# Patient Record
Sex: Male | Born: 1985 | Race: Black or African American | Hispanic: No | Marital: Single | Smoking: Never smoker
Health system: Southern US, Community
[De-identification: ages and names within clinical notes are randomized; demographics above are authoritative.]

---

## 2008-08-12 ENCOUNTER — Emergency Department (HOSPITAL_BASED_OUTPATIENT_CLINIC_OR_DEPARTMENT_OTHER): Admission: EM | Admit: 2008-08-12 | Discharge: 2008-08-12 | Payer: Self-pay | Admitting: Emergency Medicine

## 2011-07-15 LAB — COMPREHENSIVE METABOLIC PANEL
ALT: <6
AST: 24
Alkaline Phosphatase: 108
BUN: 11
Calcium: 9
Chloride: 100
Creatinine, Ser: 1
GFR calc Af Amer: >60
Potassium: 2.9 — ABNORMAL LOW
Total Protein: 8.2

## 2011-07-15 LAB — URINALYSIS, ROUTINE W REFLEX MICROSCOPIC
Protein, ur: NEGATIVE
Specific Gravity, Urine: 1.015
Urobilinogen, UA: 0.2

## 2011-07-15 LAB — CBC
MCHC: 32.9
MCV: 86.9
RDW: 13.3
WBC: 10.6 — ABNORMAL HIGH

## 2011-07-15 LAB — POCT TOXICOLOGY PANEL

## 2011-07-15 LAB — DIFFERENTIAL
Eosinophils Absolute: 1.2 — ABNORMAL HIGH
Lymphs Abs: 2.8
Monocytes Relative: 7

## 2011-07-15 LAB — ETHANOL: Alcohol, Ethyl (B): <5

## 2012-08-24 ENCOUNTER — Ambulatory Visit
Admission: RE | Admit: 2012-08-24 | Discharge: 2012-08-24 | Disposition: A | Discharge status: Home / Self Care | Payer: BC Managed Care – PPO | Source: Ambulatory Visit | Attending: Emergency Medicine | Admitting: Emergency Medicine

## 2012-08-24 ENCOUNTER — Other Ambulatory Visit: Payer: Self-pay | Admitting: Emergency Medicine

## 2012-08-24 DIAGNOSIS — S93401A Sprain of unspecified ligament of right ankle, initial encounter: Secondary | ICD-10-CM

## 2014-06-05 IMAGING — CR DG ANKLE COMPLETE 3+V*R*
3 series · 3 of 3 positions shown · non-contrast
Comparison: None.

CLINICAL DATA: Ankle sprain, posterior pain

RIGHT ANKLE - COMPLETE 3+ VIEW

[t ankle joint ap right]
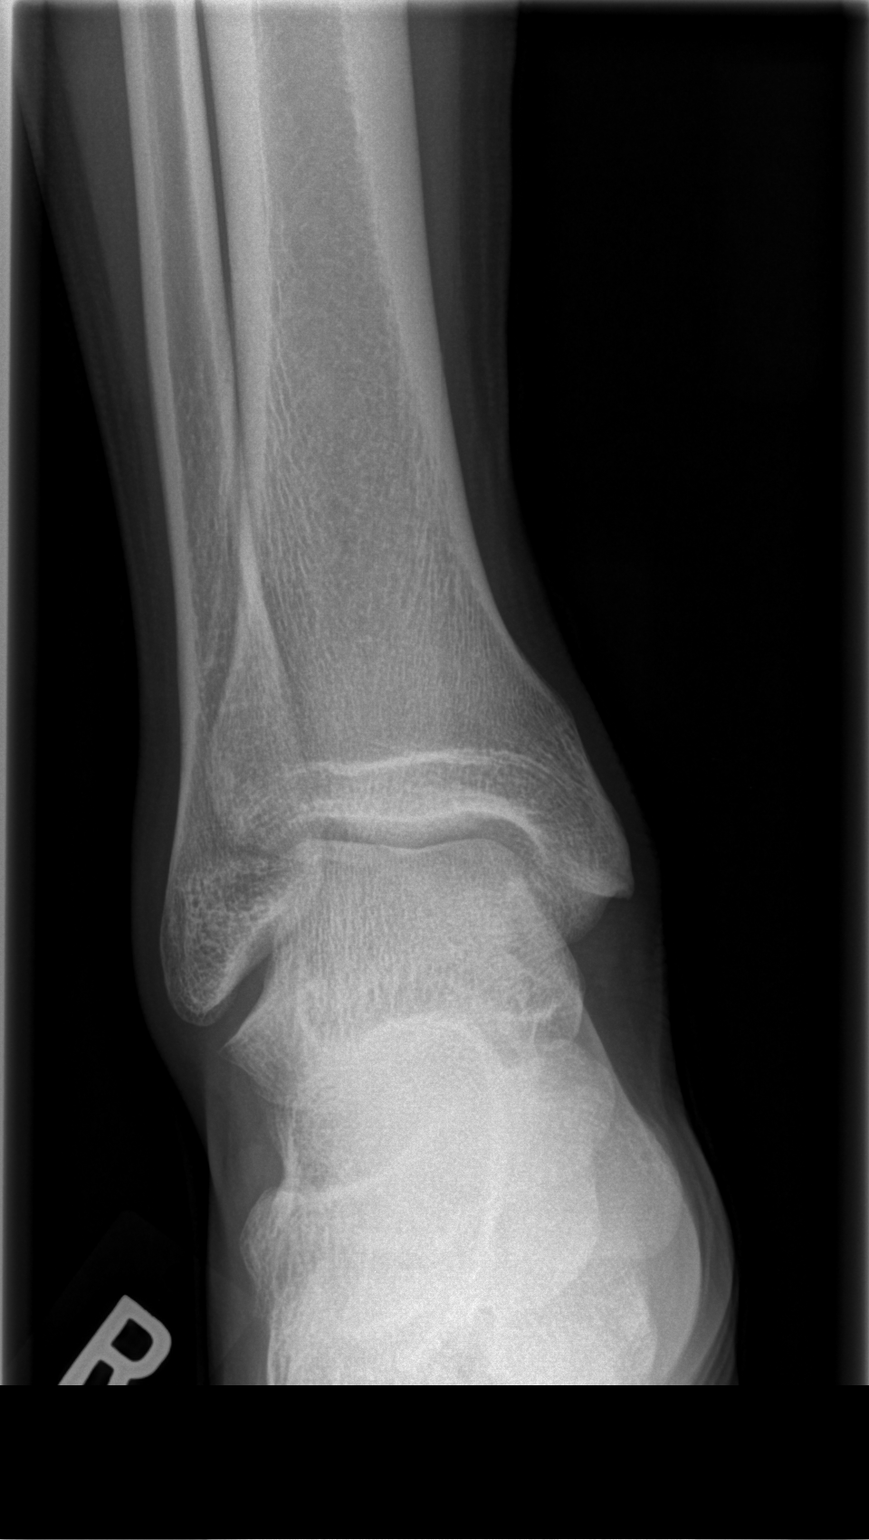

[t ankle joint oblique right]
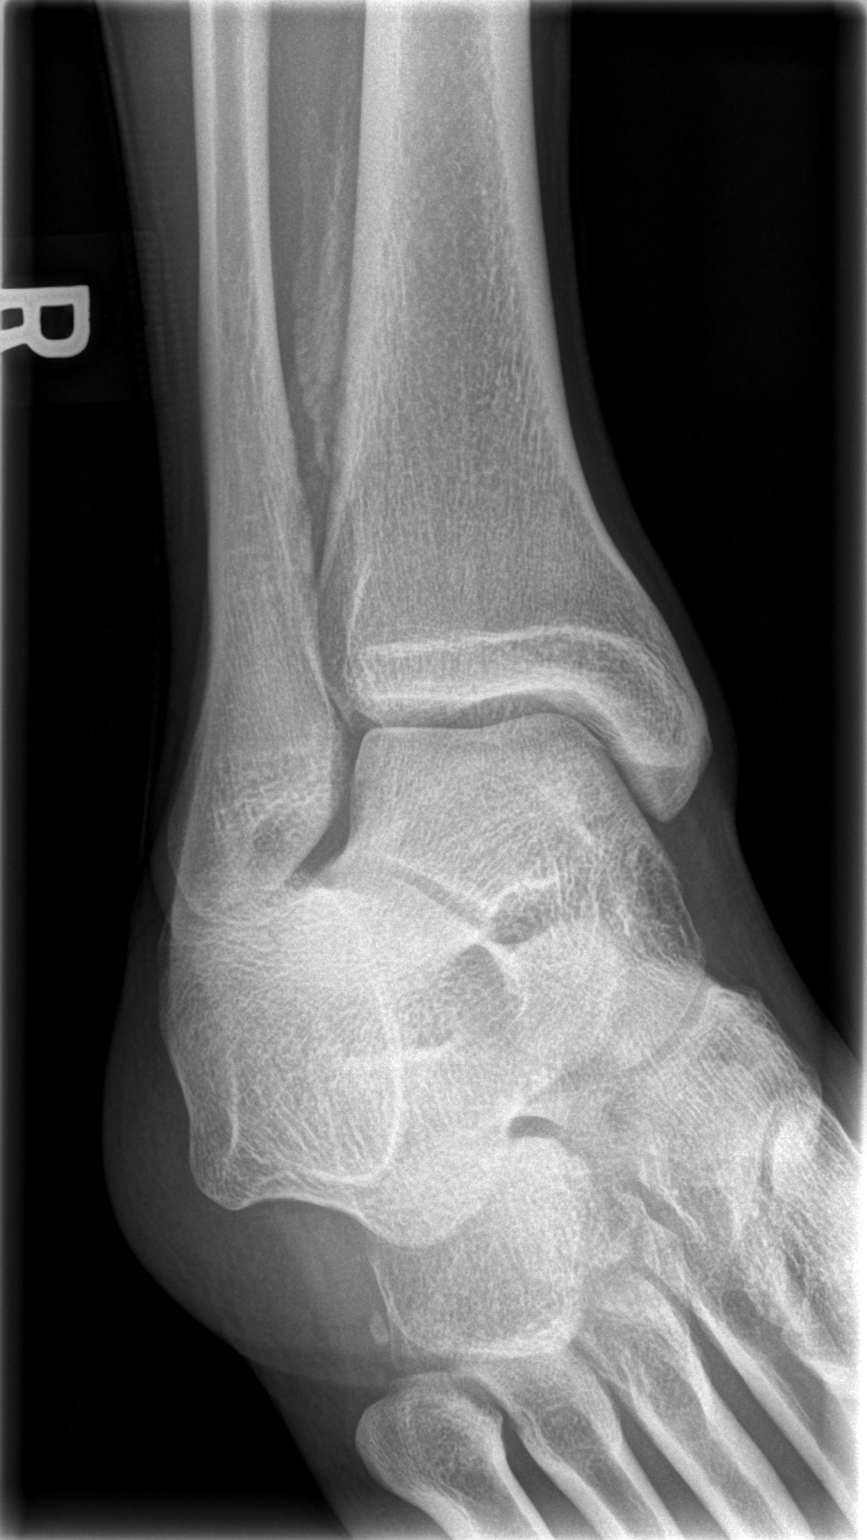

[t ankle joint lat right]
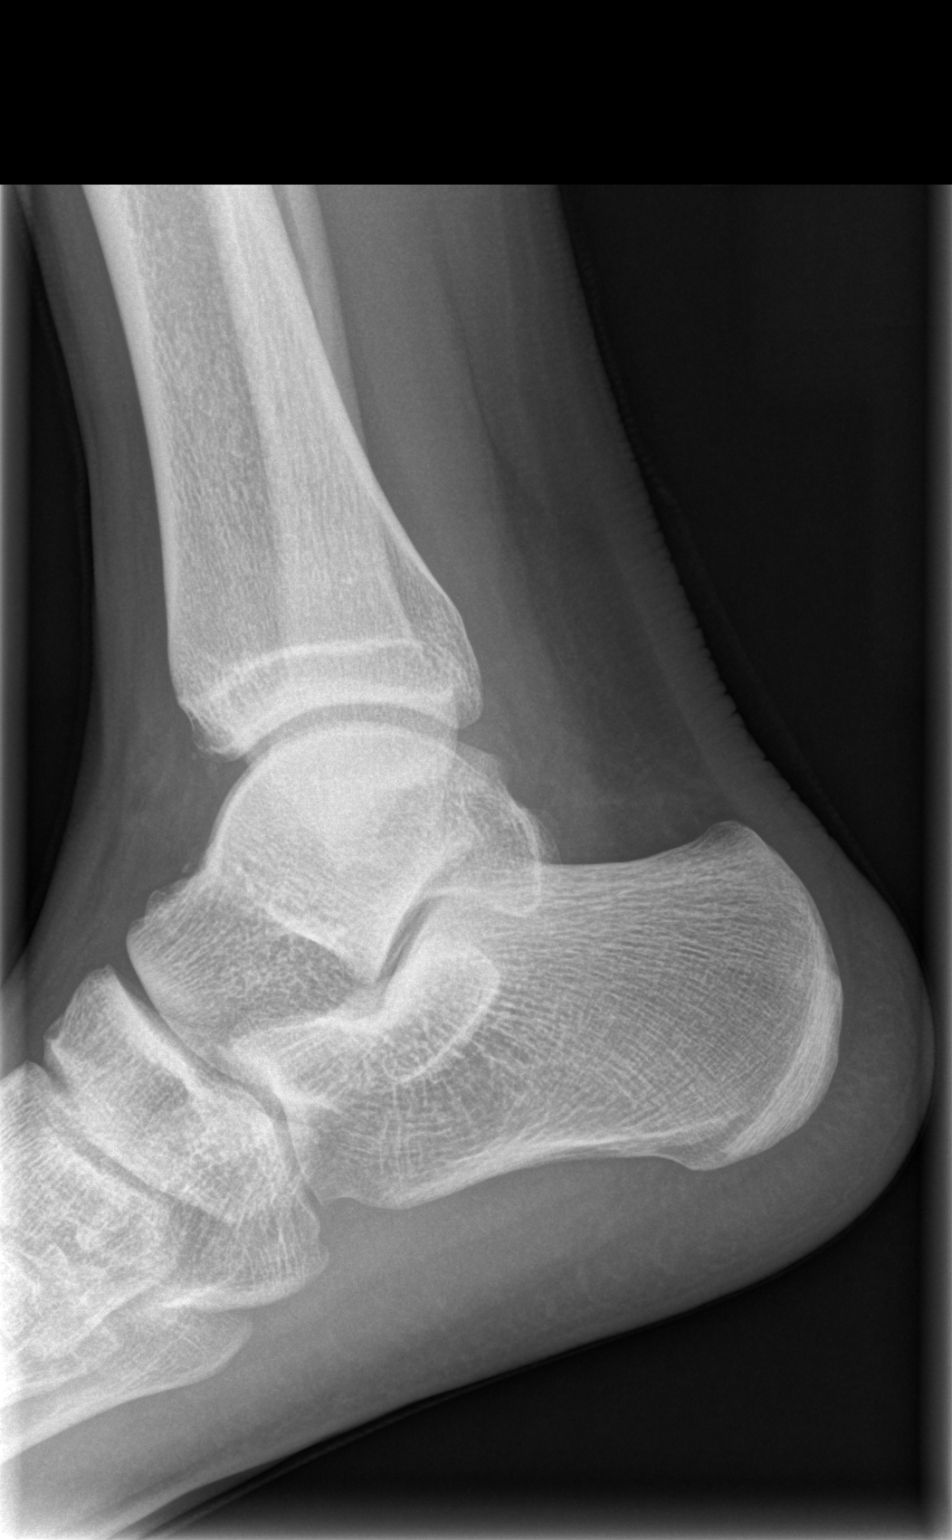

[3 of 3 positions shown; findings below may reference images not displayed]

FINDINGS: Normal alignment without fracture or effusion.
Ossification of the interosseous membrane between the right distal
tibia and fibula.  Malleoli, talus and calcaneus appear intact.
IMPRESSION: No acute osseous finding.

## 2019-05-17 ENCOUNTER — Other Ambulatory Visit (HOSPITAL_COMMUNITY): Payer: Self-pay | Admitting: Family Medicine

## 2019-05-17 DIAGNOSIS — R002 Palpitations: Secondary | ICD-10-CM

## 2019-06-14 ENCOUNTER — Ambulatory Visit (HOSPITAL_COMMUNITY)
Admission: EM | Admit: 2019-06-14 | Discharge: 2019-06-14 | Disposition: A | Discharge status: Home / Self Care | Payer: Managed Care, Other (non HMO) | Attending: Family Medicine | Admitting: Family Medicine

## 2019-06-14 ENCOUNTER — Other Ambulatory Visit: Payer: Self-pay

## 2019-06-14 ENCOUNTER — Encounter (HOSPITAL_COMMUNITY): Payer: Self-pay

## 2019-06-14 DIAGNOSIS — S86011A Strain of right Achilles tendon, initial encounter: Secondary | ICD-10-CM

## 2019-06-14 DIAGNOSIS — Y9367 Activity, basketball: Secondary | ICD-10-CM

## 2019-06-14 MED ORDER — IBUPROFEN 800 MG PO TABS: 800.0000 mg | ORAL_TABLET | Freq: Three times a day (TID) | ORAL | 0 refills | Status: AC

## 2019-06-14 NOTE — ED Provider Notes (Signed)
[facility]    CSN: [id number] Arrival date & time: 06/14/19  1008      History   Chief Complaint Chief Complaint  Patient presents with  . Appointment    10;10  . Ankle Injury    Right    HPI Richard Prudence DavidsonJabbi is a 33 y.o. male.   HPI  Patient was playing basketball last night.  He went to make a jump and felt a pop in the back of his right ankle.  He felt like something hit him right in the Achilles tendon.  He has had trouble weightbearing ever since then.  Pain with movement of his ankle.  History reviewed. No pertinent past medical history.  There are no active problems to display for this patient.   History reviewed. No pertinent surgical history.     Home Medications    Prior to Admission medications   Medication Sig Start Date End Date Taking? Authorizing Provider  ibuprofen (ADVIL) 800 MG tablet Take 1 tablet (800 mg total) by mouth 3 (three) times daily. 06/14/19   Eustace MooreNelson, Deakon Frix Sue, MD    Family History Family History  Problem Relation Age of Onset  . Healthy Mother   . Healthy Father     Social History Social History   Tobacco Use  . Smoking status: Never Smoker  . Smokeless tobacco: Never Used  Substance Use Topics  . Alcohol use: Yes    Comment: socially  . Drug use: Never     Allergies   Patient has no known allergies.   Review of Systems Review of Systems  Constitutional: Negative for chills and fever.  HENT: Negative for ear pain and sore throat.   Eyes: Negative for pain and visual disturbance.  Respiratory: Negative for cough and shortness of breath.   Cardiovascular: Negative for chest pain and palpitations.  Gastrointestinal: Negative for abdominal pain and vomiting.  Genitourinary: Negative for dysuria and hematuria.  Musculoskeletal: Positive for gait problem. Negative for arthralgias and back pain.  Skin: Negative for color change and rash.  Neurological: Negative for seizures and syncope.  All other systems  reviewed and are negative.    Physical Exam Triage Vital Signs ED Triage Vitals  Enc Vitals Group     BP 06/14/19 1032 132/89     Pulse Rate 06/14/19 1032 80     Resp 06/14/19 1032 14     Temp 06/14/19 1032 97.9 F (36.6 C)     Temp Source 06/14/19 1032 Oral     SpO2 06/14/19 1032 100 %     Weight --      Height --      Head Circumference --      Peak Flow --      Pain Score 06/14/19 1030 9     Pain Loc --      Pain Edu? --      Excl. in [place]? --    No data found.  Updated Vital Signs BP 132/89 (BP Location: Left Arm)   Pulse 80   Temp 97.9 F (36.6 C) (Oral)   Resp 14   SpO2 100%   Visual Acuity Right Eye Distance:   Left Eye Distance:   Bilateral Distance:    Right Eye Near:   Left Eye Near:    Bilateral Near:     Physical Exam Constitutional:      General: He is not in acute distress.    Appearance: He is well-developed.  HENT:  Head: Normocephalic and atraumatic.  Eyes:     Conjunctiva/sclera: Conjunctivae normal.     Pupils: Pupils are equal, round, and reactive to light.  Neck:     Musculoskeletal: Normal range of motion.  Cardiovascular:     Rate and Rhythm: Normal rate.  Pulmonary:     Effort: Pulmonary effort is normal. No respiratory distress.  Abdominal:     General: There is no distension.     Palpations: Abdomen is soft.  Musculoskeletal: Normal range of motion.     Comments: Antalgic gait.  Visible and palpable defect in the right Achilles tendon.  When standing, patient is unable to lift heel off of the floor.  Skin:    General: Skin is warm and dry.  Neurological:     Mental Status: He is alert.      UC Treatments / Results  Labs (all labs ordered are listed, but only abnormal results are displayed) Labs Reviewed - No data to display  EKG   Radiology No results found.  Procedures Procedures (including critical care time)  Medications Ordered in UC Medications - No data to display  Initial Impression / Assessment  and Plan / UC Course  I have reviewed the triage vital signs and the nursing notes.  Pertinent labs & imaging results that were available during my care of the patient were reviewed by me and considered in my medical decision making (see chart for details).     I called the sports medicine clinic to discuss management.  They recommend a foot and ankle specialist.  I called Dr. Pollie Friar office at [facility] orthopedic.  I secured an appointment with Dr. Lucia Gaskins for later this week.  I received instructions on how to manage his injury in the meantime.  He states discussed injury with patient.  He may need surgery.  Activity level reviewed.  Pain management reviewed.  Reasons for return reviewed. Final Clinical Impressions(s) / UC Diagnoses   Final diagnoses:  Rupture of right Achilles tendon, initial encounter     Discharge Instructions     Leave the brace on as much as you are able Ice to area Limit movement of the ankle CALL DR ADAIR today to set up an appointment Ibuprofen for pain   ED Prescriptions    Medication Sig Dispense Auth. Provider   ibuprofen (ADVIL) 800 MG tablet Take 1 tablet (800 mg total) by mouth 3 (three) times daily. 21 tablet Raylene Everts, MD     Controlled Substance Prescriptions [place] Controlled Substance Registry consulted? Not Applicable   Raylene Everts, MD 06/14/19 1415

## 2019-06-14 NOTE — ED Triage Notes (Signed)
Patient presents to Urgent Care with complaints of right ankle pain since injuring it while playing basketball yesterday. Patient reports he felt like his heel "popped", and he is worried he has done something to his Achilles tendon.

## 2019-06-14 NOTE — Discharge Instructions (Signed)
Leave the brace on as much as you are able Ice to area Limit movement of the ankle CALL DR ADAIR today to set up an appointment Ibuprofen for pain

## 2019-06-24 ENCOUNTER — Encounter (HOSPITAL_COMMUNITY): Payer: Self-pay | Admitting: Family Medicine

## 2019-06-28 ENCOUNTER — Telehealth (HOSPITAL_COMMUNITY): Payer: Self-pay

## 2019-06-28 NOTE — Telephone Encounter (Signed)
New message   Just an FYI. We have made several attempts to contact this patient including sending a letter to schedule or reschedule their echocardiogram. We will be removing the patient from the echo WQ.   9.15.20 @ 10:42am both # are the same - not a working #  9.11.20 mail reminder letter [facility]  8.4.20 @ 3:04pm lm on home vm - Jaegar Croft

## 2019-11-01 ENCOUNTER — Other Ambulatory Visit: Payer: Self-pay

## 2019-11-01 ENCOUNTER — Encounter (HOSPITAL_COMMUNITY): Payer: Self-pay | Admitting: Emergency Medicine

## 2019-11-01 ENCOUNTER — Ambulatory Visit (HOSPITAL_COMMUNITY)
Admission: EM | Admit: 2019-11-01 | Discharge: 2019-11-01 | Disposition: A | Discharge status: Home / Self Care | Payer: Managed Care, Other (non HMO) | Attending: Family Medicine | Admitting: Family Medicine

## 2019-11-01 DIAGNOSIS — H6693 Otitis media, unspecified, bilateral: Secondary | ICD-10-CM | POA: Diagnosis not present

## 2019-11-01 DIAGNOSIS — H66003 Acute suppurative otitis media without spontaneous rupture of ear drum, bilateral: Secondary | ICD-10-CM

## 2019-11-01 MED ORDER — FLUTICASONE PROPIONATE 50 MCG/ACT NA SUSP: 1.0000 | Freq: Every day | NASAL | 2 refills | Status: AC

## 2019-11-01 MED ORDER — AMOXICILLIN 875 MG PO TABS: 875.0000 mg | ORAL_TABLET | Freq: Two times a day (BID) | ORAL | 0 refills | Status: AC

## 2019-11-01 NOTE — ED Notes (Signed)
Bed: UC01 Expected date: 11/01/19 Expected time:  Means of arrival:  Comments: APPT at 1700

## 2019-11-01 NOTE — ED Provider Notes (Signed)
[facility]    CSN: [id number] Arrival date & time: 11/01/19  1611      History   Chief Complaint Chief Complaint  Patient presents with  . Appointment    05:00  . URI    HPI Richard Erickson is a 34 y.o. male.   Patient has upper respiratory symptoms and now complains of his ears are popping and he is dizzy.  Started sneezing only about an hour ago.  There is been no air travel or other behaviors which would lead to this problem  HPI  History reviewed. No pertinent past medical history.  There are no problems to display for this patient.   History reviewed. No pertinent surgical history.     Home Medications    Prior to Admission medications   Medication Sig Start Date End Date Taking? Authorizing Provider  ALPRAZolam Duanne Moron) 0.25 MG tablet Take 0.25 mg by mouth at bedtime as needed for anxiety.    [provider]  ibuprofen (ADVIL) 800 MG tablet Take 1 tablet (800 mg total) by mouth 3 (three) times daily. 06/14/19   Raylene Everts, MD    Family History Family History  Problem Relation Age of Onset  . Healthy Mother   . Healthy Father     Social History Social History   Tobacco Use  . Smoking status: Never Smoker  . Smokeless tobacco: Never Used  Substance Use Topics  . Alcohol use: Not Currently    Comment: socially  . Drug use: Never     Allergies   Patient has no known allergies.   Review of Systems Review of Systems  HENT: Positive for congestion and hearing loss.   Neurological: Positive for dizziness.  All other systems reviewed and are negative.    Physical Exam Triage Vital Signs ED Triage Vitals  Enc Vitals Group     BP 11/01/19 1701 133/85     Pulse Rate 11/01/19 1701 94     Resp 11/01/19 1701 18     Temp 11/01/19 1701 98.7 F (37.1 C)     Temp Source 11/01/19 1701 Oral     SpO2 11/01/19 1701 97 %     Weight --      Height --      Head Circumference --      Peak Flow --      Pain Score 11/01/19 1658  6     Pain Loc --      Pain Edu? --      Excl. in [place]? --    No data found.  Updated Vital Signs BP 133/85 (BP Location: Right Arm)   Pulse 94   Temp 98.7 F (37.1 C) (Oral)   Resp 18   SpO2 97%   Visual Acuity Right Eye Distance:   Left Eye Distance:   Bilateral Distance:    Right Eye Near:   Left Eye Near:    Bilateral Near:     Physical Exam Vitals and nursing note reviewed.  Constitutional:      Appearance: Normal appearance. He is normal weight.  HENT:     Head: Normocephalic.     Ears:     Comments: Both TMs are bulging and have a reddish tent and are immobile to with Valsalva maneuver    Nose: Nose normal.     Mouth/Throat:     Mouth: Mucous membranes are moist.     Pharynx: Oropharynx is clear.  Neurological:     Mental Status: He  is alert.      UC Treatments / Results  Labs (all labs ordered are listed, but only abnormal results are displayed) Labs Reviewed - No data to display  EKG   Radiology No results found.  Procedures Procedures (including critical care time)  Medications Ordered in UC Medications - No data to display  Initial Impression / Assessment and Plan / UC Course  I have reviewed the triage vital signs and the nursing notes.  Pertinent labs & imaging results that were available during my care of the patient were reviewed by me and considered in my medical decision making (see chart for details).     Bilateral otitis media Final Clinical Impressions(s) / UC Diagnoses   Final diagnoses:  None   Discharge Instructions   None    ED Prescriptions    None     PDMP not reviewed this encounter.   Frederica Kuster, MD 11/01/19 1730

## 2019-11-01 NOTE — ED Triage Notes (Signed)
Left ear popping, painful for one week and feels dizzy/unsteady.  Sniffles started one hour ago.

## 2019-11-05 ENCOUNTER — Other Ambulatory Visit: Payer: Self-pay

## 2019-11-05 ENCOUNTER — Ambulatory Visit
Admission: EM | Admit: 2019-11-05 | Discharge: 2019-11-05 | Disposition: A | Discharge status: Home / Self Care | Payer: Managed Care, Other (non HMO) | Attending: Physician Assistant | Admitting: Physician Assistant

## 2019-11-05 ENCOUNTER — Inpatient Hospital Stay
Admission: RE | Admit: 2019-11-05 | Discharge: 2019-11-05 | Disposition: A | Discharge status: Home / Self Care | Payer: Managed Care, Other (non HMO) | Source: Ambulatory Visit

## 2019-11-05 DIAGNOSIS — H6983 Other specified disorders of Eustachian tube, bilateral: Secondary | ICD-10-CM | POA: Diagnosis not present

## 2019-11-05 DIAGNOSIS — J3489 Other specified disorders of nose and nasal sinuses: Secondary | ICD-10-CM

## 2019-11-05 MED ORDER — AZELASTINE HCL 0.1 % NA SOLN: 2.0000 | Freq: Two times a day (BID) | NASAL | 0 refills | Status: AC

## 2019-11-05 MED ORDER — PREDNISONE 50 MG PO TABS: 50.0000 mg | ORAL_TABLET | Freq: Every day | ORAL | 0 refills | Status: AC

## 2019-11-05 NOTE — ED Provider Notes (Signed)
[facility]    CSN: [id number] Arrival date & time: 11/05/19  1135      History   Chief Complaint No chief complaint on file.   HPI Richard Erickson is a 34 y.o. male.   35 year old male comes in for continued ear fullness/popping sensation after being seen 11/01/2019.  At that time, was started on Augmentin for otitis media, and started on Flonase for symptomatic management.  States pain has improved since taking antibiotics, but has continued with ear fullness, and popping sensation to the ear.  States nasal congestion, cough has also improved.  Denies fever, chills, body aches.  Has been using medications as prescribed.      History reviewed. No pertinent past medical history.  There are no problems to display for this patient.   History reviewed. No pertinent surgical history.     Home Medications    Prior to Admission medications   Medication Sig Start Date End Date Taking? Authorizing Provider  ALPRAZolam Prudy Feeler) 0.25 MG tablet Take 0.25 mg by mouth at bedtime as needed for anxiety.    [provider]  amoxicillin (AMOXIL) 875 MG tablet Take 1 tablet (875 mg total) by mouth 2 (two) times daily. 11/01/19   Frederica Kuster, MD  azelastine (ASTELIN) 0.1 % nasal spray Place 2 sprays into both nostrils 2 (two) times daily. 11/05/19   Cathie Hoops, Jonanthony Nahar V, PA-C  fluticasone (FLONASE) 50 MCG/ACT nasal spray Place 1 spray into both nostrils daily. 11/01/19   Frederica Kuster, MD  ibuprofen (ADVIL) 800 MG tablet Take 1 tablet (800 mg total) by mouth 3 (three) times daily. 06/14/19   Eustace Moore, MD  predniSONE (DELTASONE) 50 MG tablet Take 1 tablet (50 mg total) by mouth daily with breakfast. 11/05/19   Belinda Fisher, PA-C    Family History Family History  Problem Relation Age of Onset  . Healthy Mother   . Healthy Father     Social History Social History   Tobacco Use  . Smoking status: Never Smoker  . Smokeless tobacco: Never Used  Substance Use Topics    . Alcohol use: Not Currently    Comment: socially  . Drug use: Never     Allergies   Patient has no known allergies.   Review of Systems Review of Systems  Reason unable to perform ROS: See HPI as above.     Physical Exam Triage Vital Signs ED Triage Vitals  Enc Vitals Group     BP 11/05/19 1144 114/77     Pulse Rate 11/05/19 1144 72     Resp 11/05/19 1144 17     Temp 11/05/19 1144 98.2 F (36.8 C)     Temp Source 11/05/19 1144 Oral     SpO2 11/05/19 1144 96 %     Weight --      Height --      Head Circumference --      Peak Flow --      Pain Score 11/05/19 1145 6     Pain Loc --      Pain Edu? --      Excl. in [place]? --    No data found.  Updated Vital Signs BP 114/77 (BP Location: Left Arm)   Pulse 72   Temp 98.2 F (36.8 C) (Oral)   Resp 17   SpO2 96%   Physical Exam Constitutional:      General: He is not in acute distress.    Appearance:  Normal appearance. He is not ill-appearing, toxic-appearing or diaphoretic.  HENT:     Head: Normocephalic and atraumatic.     Right Ear: Ear canal and external ear normal. A middle ear effusion is present. Tympanic membrane is not erythematous or bulging.     Left Ear: Ear canal and external ear normal. A middle ear effusion is present. Tympanic membrane is not erythematous or bulging.     Mouth/Throat:     Mouth: Mucous membranes are moist.     Pharynx: Oropharynx is clear. Uvula midline.  Cardiovascular:     Rate and Rhythm: Normal rate and regular rhythm.     Heart sounds: Normal heart sounds. No murmur. No friction rub. No gallop.   Pulmonary:     Effort: Pulmonary effort is normal. No accessory muscle usage, prolonged expiration, respiratory distress or retractions.     Comments: Lungs clear to auscultation without adventitious lung sounds. Musculoskeletal:     Cervical back: Normal range of motion and neck supple.  Neurological:     General: No focal deficit present.     Mental Status: He is alert and  oriented to person, place, and time.      UC Treatments / Results  Labs (all labs ordered are listed, but only abnormal results are displayed) Labs Reviewed - No data to display  EKG   Radiology No results found.  Procedures Procedures (including critical care time)  Medications Ordered in UC Medications - No data to display  Initial Impression / Assessment and Plan / UC Course  I have reviewed the triage vital signs and the nursing notes.  Pertinent labs & imaging results that were available during my care of the patient were reviewed by me and considered in my medical decision making (see chart for details).    Significant middle ear effusion noted on bilateral ears.  Will add prednisone and azelastine as directed.  Continue Augmentin and Flonase.  Return precautions given.  Patient expresses understanding and agrees to plan.  Final Clinical Impressions(s) / UC Diagnoses   Final diagnoses:  Dysfunction of both eustachian tubes  Sinus pressure   ED Prescriptions    Medication Sig Dispense Auth. Provider   azelastine (ASTELIN) 0.1 % nasal spray Place 2 sprays into both nostrils 2 (two) times daily. 30 mL Ashby Moskal V, PA-C   predniSONE (DELTASONE) 50 MG tablet Take 1 tablet (50 mg total) by mouth daily with breakfast. 5 tablet Ok Edwards, PA-C     PDMP not reviewed this encounter.   Ok Edwards, PA-C 11/05/19 1711

## 2019-11-05 NOTE — Discharge Instructions (Signed)
Continue augmentin as directed. Add prednisone as directed. Continue flonase and add azelastine. Follow up with ENT for further evaluation if symptoms not improving.

## 2019-11-05 NOTE — ED Triage Notes (Signed)
Patient presents with "popping" in left ear.  Initially started with fullness sensation in both ears, but Rt. Side has improved since taking Amoxicillin that was prescribed at [facility] 4 days ago for ear infection.  Cough/congestion has improved as well.

## 2020-01-22 ENCOUNTER — Other Ambulatory Visit: Payer: Self-pay | Admitting: Family Medicine
